# Patient Record
Sex: Male | Born: 1980 | Race: Black or African American | Hispanic: No | Marital: Single | Smoking: Never smoker
Health system: Southern US, Community
[De-identification: ages and names within clinical notes are randomized; demographics above are authoritative.]

## PROBLEM LIST (undated history)

## (undated) DIAGNOSIS — W3400XA Accidental discharge from unspecified firearms or gun, initial encounter: Secondary | ICD-10-CM

## (undated) HISTORY — PX: EYE SURGERY: SHX253

---

## 2006-06-06 ENCOUNTER — Emergency Department (HOSPITAL_COMMUNITY): Admission: EM | Admit: 2006-06-06 | Discharge: 2006-06-06 | Payer: Self-pay | Admitting: Emergency Medicine

## 2007-02-28 ENCOUNTER — Emergency Department (HOSPITAL_COMMUNITY): Admission: EM | Admit: 2007-02-28 | Discharge: 2007-02-28 | Payer: Self-pay | Admitting: Emergency Medicine

## 2007-10-04 IMAGING — CR DG CHEST 2V
2 series · 2 of 2 positions shown · non-contrast
Comparison: None.

Exam: Chest, 2 views.

HISTORY: Cough, sore throat, and fever.

[w chest pa]
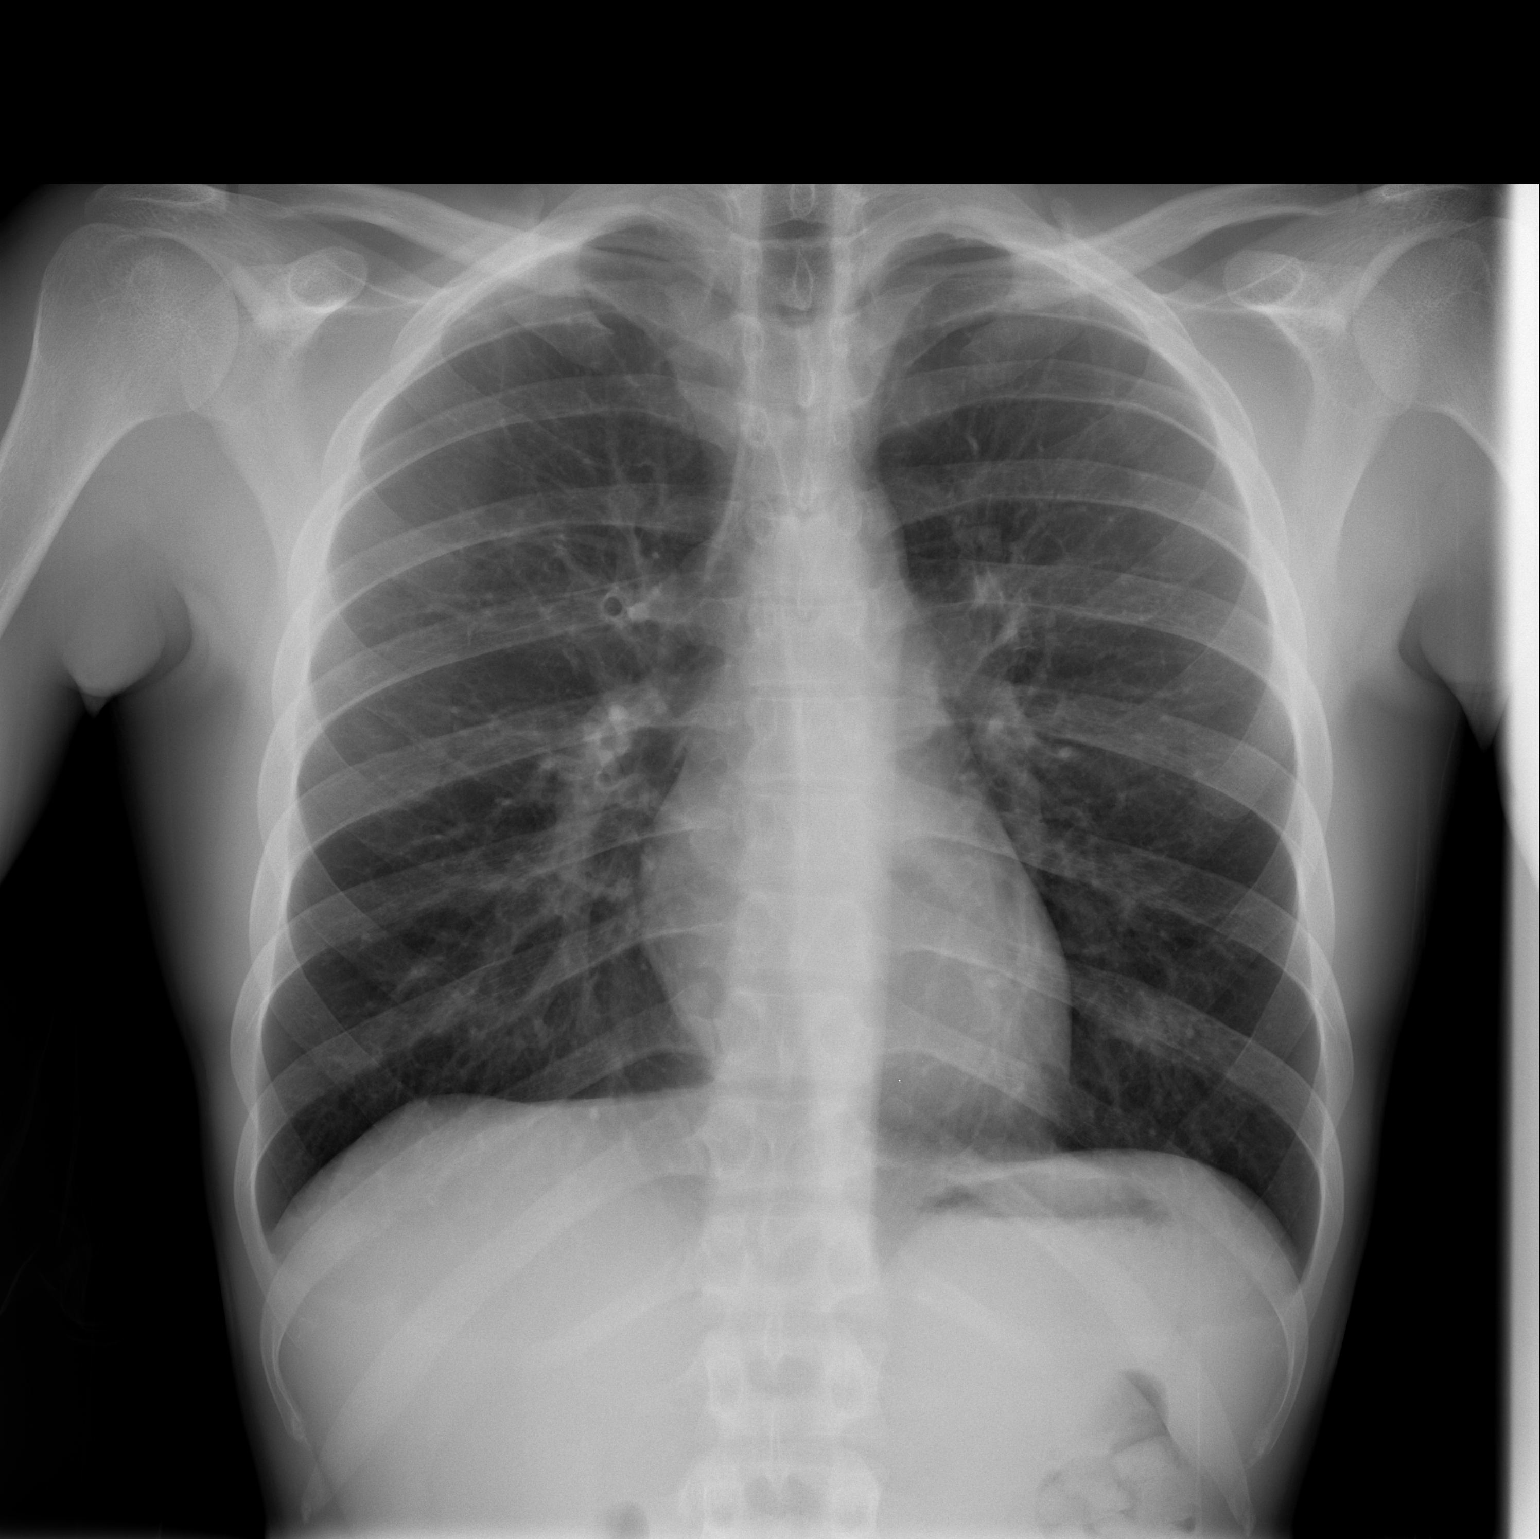

[w chest lat]
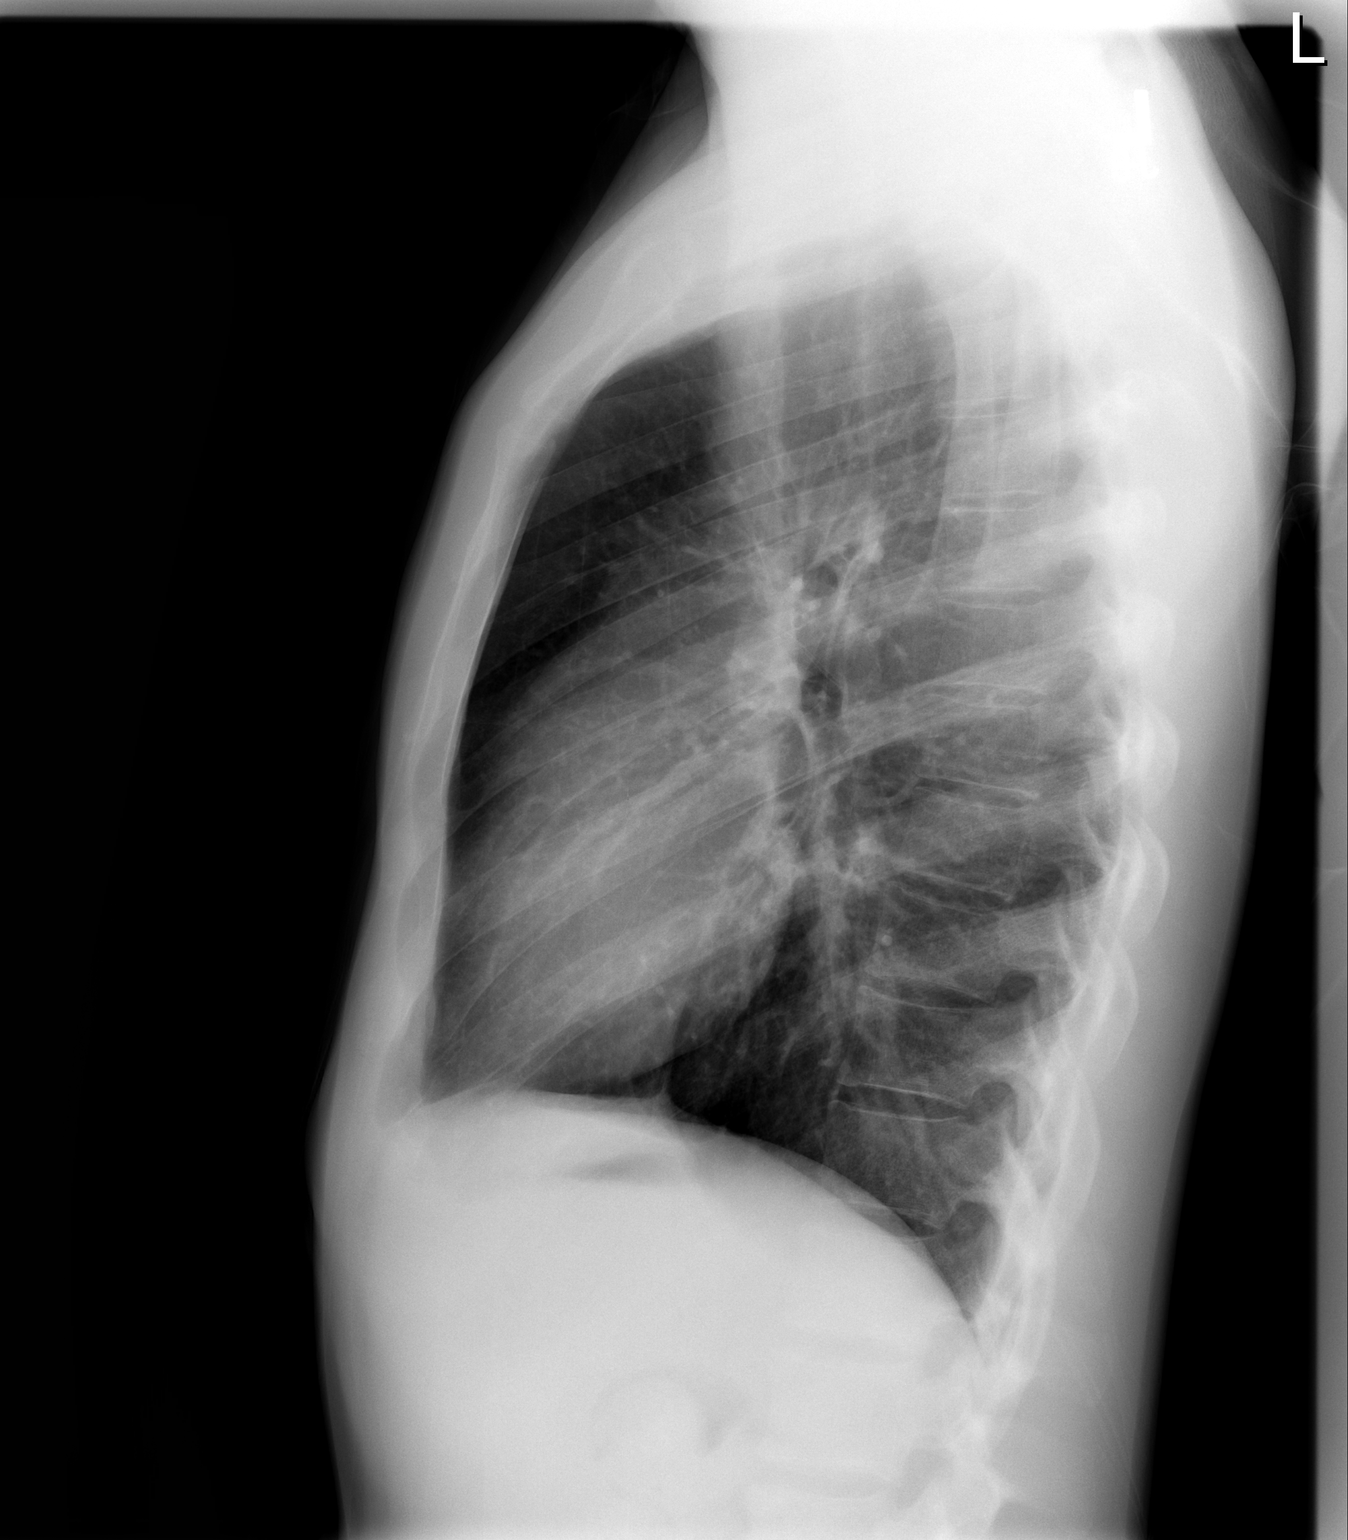

[2 of 2 positions shown; findings below may reference images not displayed]

FINDINGS: Heart size is normal.

No pleural effusions or pulmonary edema.

No airspace opacities are identified.
IMPRESSION: 1. No active pulmonary disease.
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
## 2010-05-26 ENCOUNTER — Emergency Department (HOSPITAL_COMMUNITY): Payer: No Typology Code available for payment source

## 2010-05-26 ENCOUNTER — Emergency Department (HOSPITAL_COMMUNITY): Admission: EM | Admit: 2010-05-26 | Payer: Self-pay | Source: Home / Self Care

## 2010-05-26 ENCOUNTER — Emergency Department (HOSPITAL_COMMUNITY)
Admission: EM | Admit: 2010-05-26 | Discharge: 2010-05-26 | Disposition: A | Discharge status: Home / Self Care | Payer: No Typology Code available for payment source | Attending: Emergency Medicine | Admitting: Emergency Medicine

## 2010-05-26 DIAGNOSIS — S62639A Displaced fracture of distal phalanx of unspecified finger, initial encounter for closed fracture: Secondary | ICD-10-CM | POA: Insufficient documentation

## 2010-05-26 DIAGNOSIS — M542 Cervicalgia: Secondary | ICD-10-CM | POA: Insufficient documentation

## 2010-05-26 DIAGNOSIS — M545 Low back pain, unspecified: Secondary | ICD-10-CM | POA: Insufficient documentation

## 2010-05-26 DIAGNOSIS — M79609 Pain in unspecified limb: Secondary | ICD-10-CM | POA: Insufficient documentation

## 2010-05-26 DIAGNOSIS — Y929 Unspecified place or not applicable: Secondary | ICD-10-CM | POA: Insufficient documentation

## 2010-05-30 ENCOUNTER — Inpatient Hospital Stay (INDEPENDENT_AMBULATORY_CARE_PROVIDER_SITE_OTHER)
Admission: RE | Admit: 2010-05-30 | Discharge: 2010-05-30 | Disposition: A | Discharge status: Home / Self Care | Payer: Self-pay | Source: Ambulatory Visit | Attending: Emergency Medicine | Admitting: Emergency Medicine

## 2010-05-30 DIAGNOSIS — S62639A Displaced fracture of distal phalanx of unspecified finger, initial encounter for closed fracture: Secondary | ICD-10-CM

## 2010-07-21 ENCOUNTER — Emergency Department (HOSPITAL_COMMUNITY): Payer: No Typology Code available for payment source

## 2010-07-21 ENCOUNTER — Emergency Department (HOSPITAL_COMMUNITY)
Admission: EM | Admit: 2010-07-21 | Discharge: 2010-07-21 | Disposition: A | Discharge status: Home / Self Care | Payer: No Typology Code available for payment source | Attending: Emergency Medicine | Admitting: Emergency Medicine

## 2010-07-21 DIAGNOSIS — M79609 Pain in unspecified limb: Secondary | ICD-10-CM | POA: Insufficient documentation

## 2010-07-21 DIAGNOSIS — M948X9 Other specified disorders of cartilage, unspecified sites: Secondary | ICD-10-CM | POA: Insufficient documentation

## 2010-07-21 DIAGNOSIS — S71109A Unspecified open wound, unspecified thigh, initial encounter: Secondary | ICD-10-CM | POA: Insufficient documentation

## 2010-07-21 DIAGNOSIS — W3400XA Accidental discharge from unspecified firearms or gun, initial encounter: Secondary | ICD-10-CM | POA: Insufficient documentation

## 2010-07-21 DIAGNOSIS — S71009A Unspecified open wound, unspecified hip, initial encounter: Secondary | ICD-10-CM | POA: Insufficient documentation

## 2010-07-21 DIAGNOSIS — Z1810 Retained metal fragments, unspecified: Secondary | ICD-10-CM | POA: Insufficient documentation

## 2010-07-21 LAB — COMPREHENSIVE METABOLIC PANEL
ALT: 12 U/L (ref 0–53)
Alkaline Phosphatase: 79 U/L (ref 39–117)
BUN: 14 mg/dL (ref 6–23)
CO2: 28 mEq/L (ref 19–32)
Calcium: 9.7 mg/dL (ref 8.4–10.5)
GFR calc non Af Amer: >60 mL/min (ref >60)
Glucose, Bld: 105 mg/dL — ABNORMAL HIGH (ref 70–99)
Potassium: 3.5 mEq/L (ref 3.5–5.1)
Sodium: 139 mEq/L (ref 135–145)

## 2010-07-21 LAB — POCT I-STAT, CHEM 8
Calcium, Ion: 1.11 mmol/L — ABNORMAL LOW (ref 1.12–1.32)
Creatinine, Ser: 1.5 mg/dL (ref 0.4–1.5)
Glucose, Bld: 104 mg/dL — ABNORMAL HIGH (ref 70–99)
HCT: 49 % (ref 39.0–52.0)
Hemoglobin: 16.7 g/dL (ref 13.0–17.0)
Potassium: 3.5 mEq/L (ref 3.5–5.1)
TCO2: 27 mmol/L (ref 0–100)

## 2010-07-21 LAB — CBC
Hemoglobin: 15.5 g/dL (ref 13.0–17.0)
MCH: 30.3 pg (ref 26.0–34.0)
MCHC: 35.5 g/dL (ref 30.0–36.0)
RDW: 12.8 % (ref 11.5–15.5)

## 2010-07-21 LAB — PROTIME-INR: Prothrombin Time: 15.3 seconds — ABNORMAL HIGH (ref 11.6–15.2)

## 2010-07-21 LAB — TYPE AND SCREEN
ABO/RH(D): A POS
Antibody Screen: NEGATIVE

## 2010-07-21 MED ORDER — IOHEXOL 300 MG/ML  SOLN
100.0000 mL | Freq: Once | INTRAMUSCULAR | Status: AC | PRN
  Administered 2010-07-21: 100 mL via INTRAVENOUS
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
## 2011-11-25 ENCOUNTER — Emergency Department (HOSPITAL_COMMUNITY)
Admission: EM | Admit: 2011-11-25 | Discharge: 2011-11-25 | Disposition: A | Discharge status: Home / Self Care | Payer: Self-pay | Attending: Emergency Medicine | Admitting: Emergency Medicine

## 2011-11-25 ENCOUNTER — Encounter (HOSPITAL_COMMUNITY): Payer: Self-pay | Admitting: Emergency Medicine

## 2011-11-25 DIAGNOSIS — S058X9A Other injuries of unspecified eye and orbit, initial encounter: Secondary | ICD-10-CM

## 2011-11-25 DIAGNOSIS — S0590XA Unspecified injury of unspecified eye and orbit, initial encounter: Secondary | ICD-10-CM | POA: Insufficient documentation

## 2011-11-25 DIAGNOSIS — IMO0002 Reserved for concepts with insufficient information to code with codable children: Secondary | ICD-10-CM | POA: Insufficient documentation

## 2011-11-25 HISTORY — DX: Accidental discharge from unspecified firearms or gun, initial encounter: W34.00XA

## 2011-11-25 MED ORDER — ARTIFICIAL TEARS OP OINT
TOPICAL_OINTMENT | Freq: Once | OPHTHALMIC | Status: AC
  Administered 2011-11-25: 2 via OPHTHALMIC
  Filled 2011-11-25: qty 3.5

## 2011-11-25 MED ORDER — TETRACAINE HCL 0.5 % OP SOLN
2.0000 [drp] | Freq: Once | OPHTHALMIC | Status: AC
  Administered 2011-11-25: 2 [drp] via OPHTHALMIC
  Filled 2011-11-25: qty 2

## 2011-11-25 NOTE — ED Provider Notes (Signed)
Medical screening examination/treatment/procedure(s) were performed by non-physician practitioner and as supervising physician I was immediately available for consultation/collaboration.  Jalaina Salyers, MD 11/25/11 0532 
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
## 2011-11-25 NOTE — ED Notes (Signed)
Ph 7 in left eye, 8 in right; NP Gail aware.
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
## 2011-11-25 NOTE — ED Notes (Signed)
PT ambulated with baseline gait; VSS; A&Ox3; no signs of distress; respirations even and unlabored; skin warm and dry; no questions upon discharge.  

## 2011-11-25 NOTE — ED Notes (Addendum)
Patient says both eyes got bleach splashed in them when he was cleaning the bathroom.  Patient says both eyes hurt, the feeling is almost like they are severely dry.  Eyes look red but pupils are equal and reactive.

## 2013-03-07 ENCOUNTER — Emergency Department (HOSPITAL_COMMUNITY)
Admission: EM | Admit: 2013-03-07 | Discharge: 2013-03-07 | Disposition: A | Discharge status: Home / Self Care | Payer: Self-pay | Attending: Emergency Medicine | Admitting: Emergency Medicine

## 2013-03-07 DIAGNOSIS — Z880 Allergy status to penicillin: Secondary | ICD-10-CM | POA: Insufficient documentation

## 2013-03-07 DIAGNOSIS — J3489 Other specified disorders of nose and nasal sinuses: Secondary | ICD-10-CM | POA: Insufficient documentation

## 2013-03-07 DIAGNOSIS — H109 Unspecified conjunctivitis: Secondary | ICD-10-CM | POA: Insufficient documentation

## 2013-03-07 DIAGNOSIS — Z87828 Personal history of other (healed) physical injury and trauma: Secondary | ICD-10-CM | POA: Insufficient documentation

## 2013-03-07 MED ORDER — ERYTHROMYCIN 5 MG/GM OP OINT: 1.0000 "application " | TOPICAL_OINTMENT | Freq: Four times a day (QID) | OPHTHALMIC | Status: DC

## 2013-03-07 MED ORDER — TETRACAINE HCL 0.5 % OP SOLN
1.0000 [drp] | Freq: Once | OPHTHALMIC | Status: AC
  Administered 2013-03-07: 1 [drp] via OPHTHALMIC
  Filled 2013-03-07: qty 2

## 2013-03-07 NOTE — Discharge Instructions (Signed)

## 2013-03-07 NOTE — ED Notes (Signed)
Pt states he started seeing eye drainage and redness in his L eye Friday. Has been taking OTC meds since and it has looked better.
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
## 2013-03-18 NOTE — ED Provider Notes (Signed)
Medical screening examination/treatment/procedure(s) were performed by non-physician practitioner and as supervising physician I was immediately available for consultation/collaboration.  EKG Interpretation   None        Jamire Shabazz K Apostolos Blagg-Rasch, MD 03/18/13 0002

## 2013-08-19 ENCOUNTER — Encounter (HOSPITAL_COMMUNITY): Payer: Self-pay | Admitting: Emergency Medicine

## 2013-08-19 ENCOUNTER — Emergency Department (HOSPITAL_COMMUNITY)
Admission: EM | Admit: 2013-08-19 | Discharge: 2013-08-19 | Disposition: A | Discharge status: Home / Self Care | Payer: Worker's Compensation | Attending: Emergency Medicine | Admitting: Emergency Medicine

## 2013-08-19 DIAGNOSIS — Y9389 Activity, other specified: Secondary | ICD-10-CM | POA: Insufficient documentation

## 2013-08-19 DIAGNOSIS — W268XXA Contact with other sharp object(s), not elsewhere classified, initial encounter: Secondary | ICD-10-CM | POA: Insufficient documentation

## 2013-08-19 DIAGNOSIS — S61512A Laceration without foreign body of left wrist, initial encounter: Secondary | ICD-10-CM

## 2013-08-19 DIAGNOSIS — Y9269 Other specified industrial and construction area as the place of occurrence of the external cause: Secondary | ICD-10-CM | POA: Insufficient documentation

## 2013-08-19 DIAGNOSIS — Z880 Allergy status to penicillin: Secondary | ICD-10-CM | POA: Insufficient documentation

## 2013-08-19 DIAGNOSIS — Z87828 Personal history of other (healed) physical injury and trauma: Secondary | ICD-10-CM | POA: Insufficient documentation

## 2013-08-19 DIAGNOSIS — Z79899 Other long term (current) drug therapy: Secondary | ICD-10-CM | POA: Insufficient documentation

## 2013-08-19 DIAGNOSIS — S61509A Unspecified open wound of unspecified wrist, initial encounter: Secondary | ICD-10-CM | POA: Insufficient documentation

## 2013-08-19 MED ORDER — TETANUS-DIPHTH-ACELL PERTUSSIS 5-2.5-18.5 LF-MCG/0.5 IM SUSP
0.5000 mL | Freq: Once | INTRAMUSCULAR | Status: AC
  Administered 2013-08-19: 0.5 mL via INTRAMUSCULAR
  Filled 2013-08-19: qty 0.5

## 2013-08-19 MED ORDER — TRAMADOL HCL 50 MG PO TABS
50.0000 mg | ORAL_TABLET | Freq: Once | ORAL | Status: AC
  Administered 2013-08-19: 50 mg via ORAL
  Filled 2013-08-19: qty 1

## 2013-08-19 NOTE — ED Notes (Signed)
Patient was at work, was using a box cutter to open a box at work, cut left wrist/forearm area.  Bleeding controlled.
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
## 2013-08-19 NOTE — ED Notes (Signed)
Discharge instructions reviewed and patient voiced understanding  

## 2013-08-19 NOTE — Discharge Instructions (Signed)
Keep wound clean. Return to the ED or follow up with your primary care provider in 10 days for suture removal. Refer to attached documents for more information.

## 2013-08-19 NOTE — ED Notes (Signed)
Phlebotomy notified for workmans comp urine sample

## 2013-08-21 NOTE — ED Provider Notes (Signed)
Medical screening examination/treatment/procedure(s) were performed by non-physician practitioner and as supervising physician I was immediately available for consultation/collaboration.   EKG Interpretation None        Kathleen M McManus, DO 08/21/13 1430 

## 2013-08-30 ENCOUNTER — Encounter (HOSPITAL_COMMUNITY): Payer: Self-pay | Admitting: Emergency Medicine

## 2013-08-30 ENCOUNTER — Emergency Department (HOSPITAL_COMMUNITY)
Admission: EM | Admit: 2013-08-30 | Discharge: 2013-08-30 | Disposition: A | Discharge status: Home / Self Care | Payer: Worker's Compensation | Attending: Emergency Medicine | Admitting: Emergency Medicine

## 2013-08-30 DIAGNOSIS — Z4802 Encounter for removal of sutures: Secondary | ICD-10-CM | POA: Insufficient documentation

## 2013-08-30 DIAGNOSIS — Z880 Allergy status to penicillin: Secondary | ICD-10-CM | POA: Insufficient documentation

## 2013-08-30 DIAGNOSIS — Z87828 Personal history of other (healed) physical injury and trauma: Secondary | ICD-10-CM | POA: Insufficient documentation

## 2013-08-30 NOTE — ED Provider Notes (Signed)
Medical screening examination/treatment/procedure(s) were performed by non-physician practitioner and as supervising physician I was immediately available for consultation/collaboration.   EKG Interpretation None        Jamala Kohen, MD 08/30/13 0551 
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
## 2013-08-30 NOTE — Discharge Instructions (Signed)
1. Medications: usual home medications 2. Treatment: rest, drink plenty of fluids,  3. Follow Up: Please followup with your primary doctor for discussion of your diagnoses and further evaluation after today's visit; if you do not have a primary care doctor use the resource guide provided to find one;     Suture Removal, Care After Refer to this sheet in the next few weeks. These instructions provide you with information on caring for yourself after your procedure. Your health care provider may also give you more specific instructions. Your treatment has been planned according to current medical practices, but problems sometimes occur. Call your health care provider if you have any problems or questions after your procedure. WHAT TO EXPECT AFTER THE PROCEDURE After your stitches (sutures) are removed, it is typical to have the following:  Some discomfort and swelling in the wound area.  Slight redness in the area. HOME CARE INSTRUCTIONS   If you have skin adhesive strips over the wound area, do not take the strips off. They will fall off on their own in a few days. If the strips remain in place after 14 days, you may remove them.  Change any bandages (dressings) at least once a day or as directed by your health care provider. If the bandage sticks, soak it off with warm, soapy water.  Apply cream or ointment only as directed by your health care provider. If using cream or ointment, wash the area with soap and water 2 times a day to remove all the cream or ointment. Rinse off the soap and pat the area dry with a clean towel.  Keep the wound area dry and clean. If the bandage becomes wet or dirty, or if it develops a bad smell, change it as soon as possible.  Continue to protect the wound from injury.  Use sunscreen when out in the sun. New scars become sunburned easily. SEEK MEDICAL CARE IF:  You have increasing redness, swelling, or pain in the wound.  You see pus coming from the  wound.  You have a fever.  You notice a bad smell coming from the wound or dressing.  Your wound breaks open (edges not staying together). Document Released: 11/12/2000 Document Revised: 12/08/2012 Document Reviewed: 09/29/2012 [facility] Patient Information 2015 [place], [place]. This information is not intended to replace advice given to you by your health care provider. Make sure you discuss any questions you have with your health care provider.

## 2013-08-30 NOTE — ED Notes (Signed)
Patient need sutures removed from right wrist.  No redness or signs of infection noted.

## 2014-09-20 DIAGNOSIS — W1789XA Other fall from one level to another, initial encounter: Secondary | ICD-10-CM | POA: Insufficient documentation

## 2014-09-20 DIAGNOSIS — Y9289 Other specified places as the place of occurrence of the external cause: Secondary | ICD-10-CM | POA: Insufficient documentation

## 2014-09-20 DIAGNOSIS — S0990XA Unspecified injury of head, initial encounter: Secondary | ICD-10-CM | POA: Insufficient documentation

## 2014-09-20 DIAGNOSIS — Z880 Allergy status to penicillin: Secondary | ICD-10-CM | POA: Insufficient documentation

## 2014-09-20 DIAGNOSIS — Y998 Other external cause status: Secondary | ICD-10-CM | POA: Insufficient documentation

## 2014-09-20 DIAGNOSIS — F0781 Postconcussional syndrome: Secondary | ICD-10-CM | POA: Insufficient documentation

## 2014-09-20 DIAGNOSIS — S199XXA Unspecified injury of neck, initial encounter: Secondary | ICD-10-CM | POA: Insufficient documentation

## 2014-09-20 DIAGNOSIS — Y9389 Activity, other specified: Secondary | ICD-10-CM | POA: Insufficient documentation

## 2014-09-21 ENCOUNTER — Emergency Department (HOSPITAL_COMMUNITY)
Admission: EM | Admit: 2014-09-21 | Discharge: 2014-09-21 | Disposition: A | Discharge status: Home / Self Care | Payer: Self-pay | Attending: Emergency Medicine | Admitting: Emergency Medicine

## 2014-09-21 ENCOUNTER — Emergency Department (HOSPITAL_COMMUNITY): Payer: Self-pay

## 2014-09-21 ENCOUNTER — Encounter (HOSPITAL_COMMUNITY): Payer: Self-pay | Admitting: Emergency Medicine

## 2014-09-21 DIAGNOSIS — F0781 Postconcussional syndrome: Secondary | ICD-10-CM

## 2014-09-21 MED ORDER — HYDROCODONE-ACETAMINOPHEN 5-325 MG PO TABS
ORAL_TABLET | ORAL | Status: AC
Start: 2014-09-21 — End: ?

## 2014-09-21 MED ORDER — HYDROCODONE-ACETAMINOPHEN 5-325 MG PO TABS
1.0000 | ORAL_TABLET | Freq: Once | ORAL | Status: AC
  Administered 2014-09-21: 1 via ORAL
  Filled 2014-09-21: qty 1

## 2014-09-21 NOTE — ED Notes (Addendum)
Pt from work c/o a fan falling on his head at about 2200 on 7/18. He reports top of head pain and neck pain.  He denies LOC
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
## 2014-09-21 NOTE — ED Provider Notes (Signed)
1610 - Patient care assumed from [facility], PA-C at shift change. Plan discussed with [place], PA-C which includes d/c if imaging negative. Imaging from today's evaluation reviewed. X-ray of the cervical spine is negative for fracture or bony deformity. Patient to be discharged with prescription for Vicodin, prescribed by prior provider. Return precautions given at discharge. Patient discharged in good condition.   Dg Cervical Spine Complete  09/21/2014   CLINICAL DATA:  34 year old male with trauma to the head and neck.  EXAM: CERVICAL SPINE  4+ VIEWS  COMPARISON:  Radiograph dated 05/26/2010  FINDINGS: There is no evidence of cervical spine fracture or prevertebral soft tissue swelling. Alignment is normal. No other significant bone abnormalities are identified.  IMPRESSION: No acute fracture or subluxation.   Electronically Signed   By: Elgie Collard M.D.   On: 09/21/2014 02:29      Antony Madura, PA-C 09/21/14 0240  Mirian Mo, MD 09/21/14 [phone]

## 2014-09-21 NOTE — Discharge Instructions (Signed)
Take vicodin for breakthrough pain, do not drink alcohol, drive, care for children or do other critical tasks while taking vicodin.  Do not participate in any sports or any activities that could result in head trauma until you are cleared by your pediatrician,  primary care physician or neurologist.   Do not hesitate to return to the emergency room for any new, worsening or concerning symptoms.  Please obtain primary care using resource guide below. Let them know that you were seen in the emergency room and that they will need to obtain records for further outpatient management.    Concussion A concussion is a brain injury. It is caused by:  A hit to the head.  A quick and sudden movement (jolt) of the head or neck. A concussion is usually not life threatening. Even so, it can cause serious problems. If you had a concussion before, you may have concussion-like problems after a hit to your head. HOME CARE General Instructions  Follow your doctor's directions carefully.  Take medicines only as told by your doctor.  Only take medicines your doctor says are safe.  Do not drink alcohol until your doctor says it is okay. Alcohol and some drugs can slow down healing. They can also put you at risk for further injury.  If you are having trouble remembering things, write them down.  Try to do one thing at a time if you get distracted easily. For example, do not watch TV while making dinner.  Talk to your family members or close friends when making important decisions.  Follow up with your doctor as told.  Watch your symptoms. Tell others to do the same. Serious problems can sometimes happen after a concussion. Older adults are more likely to have these problems.  Tell your teachers, school nurse, school counselor, coach, Event organiser, or work Production designer, theatre/television/film about your concussion. Tell them about what you can or cannot do. They should watch to see if:  It gets even harder for you to pay  attention or concentrate.  It gets even harder for you to remember things or learn new things.  You need more time than normal to finish things.  You become annoyed (irritable) more than before.  You are not able to deal with stress as well.  You have more problems than before.  Rest. Make sure you:  Get plenty of sleep at night.  Go to sleep early.  Go to bed at the same time every day. Try to wake up at the same time.  Rest during the day.  Take naps when you feel tired.  Limit activities where you have to think a lot or concentrate. These include:  Doing homework.  Doing work related to a job.  Watching TV.  Using the computer. Returning To Your Regular Activities Return to your normal activities slowly, not all at once. You must give your body and brain enough time to heal.   Do not play sports or do other athletic activities until your doctor says it is okay.  Ask your doctor when you can drive, ride a bicycle, or work other vehicles or machines. Never do these things if you feel dizzy.  Ask your doctor about when you can return to work or school. Preventing Another Concussion It is very important to avoid another brain injury, especially before you have healed. In rare cases, another injury can lead to permanent brain damage, brain swelling, or death. The risk of this is greatest during the first 7-10  days after your injury. Avoid injuries by:   Wearing a seat belt when riding in a car.  Not drinking too much alcohol.  Avoiding activities that could lead to a second concussion (such as contact sports).  Wearing a helmet when doing activities like:  Biking.  Skiing.  Skateboarding.  Skating.  Making your home safer by:  Removing things from the floor or stairways that could make you trip.  Using grab bars in bathrooms and handrails by stairs.  Placing non-slip mats on floors and in bathtubs.  Improve lighting in dark areas. GET HELP IF:  It  gets even harder for you to pay attention or concentrate.  It gets even harder for you to remember things or learn new things.  You need more time than normal to finish things.  You become annoyed (irritable) more than before.  You are not able to deal with stress as well.  You have more problems than before.  You have problems keeping your balance.  You are not able to react quickly when you should. Get help if you have any of these problems for more than 2 weeks:   Lasting (chronic) headaches.  Dizziness or trouble balancing.  Feeling sick to your stomach (nausea).  Seeing (vision) problems.  Being affected by noises or light more than normal.  Feeling sad, low, down in the dumps, blue, gloomy, or empty (depressed).  Mood changes (mood swings).  Feeling of fear or nervousness about what may happen (anxiety).  Feeling annoyed.  Memory problems.  Problems concentrating or paying attention.  Sleep problems.  Feeling tired all the time. GET HELP RIGHT AWAY IF:   You have bad headaches or your headaches get worse.  You have weakness (even if it is in one hand, leg, or part of the face).  You have loss of feeling (numbness).  You feel off balance.  You keep throwing up (vomiting).  You feel tired.  One black center of your eye (pupil) is larger than the other.  You twitch or shake violently (convulse).  Your speech is not clear (slurred).  You are more confused, easily angered (agitated), or annoyed than before.  You have more trouble resting than before.  You are unable to recognize people or places.  You have neck pain.  It is difficult to wake you up.  You have unusual behavior changes.  You pass out (lose consciousness). MAKE SURE YOU:   Understand these instructions.  Will watch your condition.  Will get help right away if you are not doing well or get worse. Document Released: 02/05/2009 Document Revised: 07/04/2013 Document  Reviewed: 09/09/2012 [facility] Patient Information 2015 [place], [place]. This information is not intended to replace advice given to you by your health care provider. Make sure you discuss any questions you have with your health care provider.  Emergency Department Resource Guide 1) Find a Doctor and Pay Out of Pocket Although you won't have to find out who is covered by your insurance plan, it is a good idea to ask around and get recommendations. You will then need to call the office and see if the doctor you have chosen will accept you as a new patient and what types of options they offer for patients who are self-pay. Some doctors offer discounts or will set up payment plans for their patients who do not have insurance, but you will need to ask so you aren't surprised when you get to your appointment.  2) Contact Your Local Health  Department Not all health departments have doctors that can see patients for sick visits, but many do, so it is worth a call to see if yours does. If you don't know where your local health department is, you can check in your phone book. The CDC also has a tool to help you locate your state's health department, and many state websites also have listings of all of their local health departments.  3) Find a Walk-in Clinic If your illness is not likely to be very severe or complicated, you may want to try a walk in clinic. These are popping up all over the country in pharmacies, drugstores, and shopping centers. They're usually staffed by nurse practitioners or physician assistants that have been trained to treat common illnesses and complaints. They're usually fairly quick and inexpensive. However, if you have serious medical issues or chronic medical problems, these are probably not your best option.  No Primary Care Doctor: - Call Health Connect at  [phone] - they can help you locate a primary care doctor that  accepts your insurance, provides certain services,  etc. - Physician Referral Service- [phone]  Chronic Pain Problems: Organization         Address  Phone   Notes  [facility]  [phone] Patients need to be referred by their primary care doctor.   Medication Assistance: Organization         Address  Phone   Notes  [facility] Medication [facility] [address] [place], [place] [address] [phone] --Must be a resident of [facility] -- Must have NO insurance coverage whatsoever (no Medicaid/ Medicare, etc.) -- The pt. MUST have a primary care doctor that directs their care regularly and follows them in the community   [facility]ist  [phone]   [employer]  [phone]    Agencies that provide inexpensive medical care: Organization         Address  Phone   Notes  [facility]  [phone]   [facility]    [phone]   [facility] [address] [place], [place] [address] [phone]   [facility] [address] [place], [place] [phone]   Planned Parenthood    [phone]   [facility]    [phone]   [facility]  [address], [place] Phone:  [phone], Fax:  [phone] Hours of Operation:  9 am - 6 pm, M-F.  Also accepts Medicaid/Medicare and self-pay.  [facility]  [address], [place] Phone: [phone], Fax: [phone]. Hours of Operation:  8:30 am - 5:30 pm, M-F.  Also accepts Medicaid and self-pay.  [facility] [place] [address] [place] Phone: [phone]   [facility] [address] Natasha Bence [place], [place] [phone], Ext. [phone] Mondays & Thursdays: 7-9 AM.  First 15 patients are seen on a first come, first serve basis.    Medicaid-accepting [facility] Providers:  Organization         Address  Phone   Notes  [facility] [address], [place] [phone] Also accepts self-pay patients.  [facility] [address] Laurell Josephs [place], [place]  [phone]   [facility] [address], [place] [phone]  [facility] [address] [place] [phone]   Renaye Rakers [address], [place]   [phone] Only accepts [place] Access [place] patients after they have their name applied to their card.   Self-Pay (no insurance) in [facility]:  Organization         Address  Phone   Notes  Sickle Cell Patients, [facility] [address] [place], [place] [phone]   [facility] [address] [place], [place] [phone]   [facility] [place]  [address], [place] [phone]   [facility]/Dr. Osei-Bonsu  [address] [place] or [address], [place] [phone] Phone number for both [place] and [place] locations is the same.  [facility]gent [facility] and [facility] [address] [place] [phone]   [facility] [address], [place] or [address] [phone]   [facility] [address], [place] [phone], phone; [phone], fax Sees patients 1st and 3rd Saturday of every month.  Must not qualify for public or private insurance (i.e. Medicaid, Medicare, [place] Health Choice, Veterans' Benefits)  Household income should be no more than 200% of the poverty level The clinic cannot treat you if you are pregnant or think you are pregnant  Sexually transmitted diseases are not treated at the clinic.    Dental Care: Organization         Address  Phone  Notes  [facility] Department of [facility] [address] [place], [place] [phone] Accepts children up to  age 63 who are enrolled in [place] or [place] Health Choice; pregnant women with a Medicaid card; and children who have applied for Medicaid or [place] Health Choice, but were declined, whose parents can pay a reduced fee at time of service.  [facility] Department of [facility]  [address], [place] [phone] Accepts children up to age 9 who are enrolled in [place] or [place] Health Choice; pregnant women with a Medicaid card; and children who have applied for Medicaid or [place] Health Choice, but were declined, whose parents can pay a reduced fee at time of service.  [facility]  [address] [place], [place] [phone] Patients are seen by appointment only. Walk-ins are not accepted. [facility] will see patients 39 years of age and older. Monday - Tuesday (8am-5pm) Most Wednesdays (8:30-5pm) $30 per visit, cash only  [facility] Adult Dental Access PRO[facility]  [address], [facility] [phone] Patients are seen by appointment only. Walk-ins are not accepted. [facility] will see patients 44 years of age and older. One Wednesday Evening (Monthly: Volunteer Based).  $30 per visit, cash only  [employer]  [phone] for adults; Children under age 10, call Graduate Pediatric Dentistry at [phone]. Children aged 68-14, please call [phone] to request a pediatric application.  Dental services are provided in all areas of dental care including fillings, crowns and bridges, complete and partial dentures, implants, gum treatment, root canals, and extractions. Preventive care is also provided. Treatment is provided to both adults and children. Patients are selected via a lottery and there is often a waiting list.   [facility] [address] [place]  [phone] [email or web address]  [facility] [address] [place], [place] [phone], Ext. [phone] Second and Fourth Thursday of  each month, opens at 6:30 AM; Clinic ends at 9 AM.  Patients are seen on a first-come first-served basis, and a limited number are seen during each clinic.   [facility]  [address] Ether [place]ins [place], [place] [phone]   Eligibility Requirements You must have lived in [place], [place], or [place] counties for at least the last three months.   You cannot be eligible for state or federal sponsored [place], including [employer], [place], or [employer].   You generally cannot be eligible for healthcare insurance through your employer.    How to apply: Eligibility screenings are held every Tuesday and Wednesday afternoon from 1:00 pm until 4:00 pm. You do not need an appointment for the interview!  [facility] [address], [place], [place] [phone]   [facility] Health Department  [phone]   [facility] Health Department  [phone]   [facility] Health Department  [phone]    Behavioral Health Resources in the Community: Intensive Outpatient Programs Organization         Address  Phone  Notes  [facility] [address] [place], [place] [phone]   [facility] [address] [place], [place] [phone]   [facility] [address], [place], [place]  [phone]   [facility] [address]  [place], [place] [phone] or [phone]   Substance Abuse Resources Organization         Address  Phone  Notes  [facility]  [phone]   [facility]  [phone]   [facility] [place]  [phone]   Floydene Flock  [phone]   Residential & Outpatient Substance Abuse Program  [phone]   Psychological Services Organization         Address  Phone  Notes  [facility]  [phone]   [facility]  [phone]   [facility] [address]  [place] [phone] or [phone]    Mobile Crisis Teams Organization         Address  Phone  Notes  [facility], Mobile Crisis Care Unit  [phone]   [facility]  [address] [place], [place] [phone]   Doristine Locks [address] [place] [phone]    Self-Help/Support Groups Organization         Address  Phone             Notes  [facility] [place] - variety of support groups  336- I7437963 Call for more information  Narcotics Anonymous (NA), [facility] [address], [employer] [place]  2 meetings at this location   Statistician         Address  Phone  Notes  [facility] [address],    [place]  [phone]   [facility]  [address] [place] [address], [place], [place] [phone]   [facility] [address] [place], [place] [phone] Admissions: 8am-3pm M-F  [facility] [address]    [place], [place] [phone]   [facility] [address] Starling Manns [place], [place] [phone]   [facility] [address],  [place], [place] [phone]   [facility] - Intensive Outpatient [phone] Laurell Josephs [address], [place], [place] [phone]   [facility] ([facility]iction [facility] [address] [place]  [place], [place] [phone] or [phone]   [facility] ([facility]) [address] [place], [place] [phone] Accepts Medicaid  [facility] [address]  [place] [phone] Substance Abuse/Addiction Treatment   [facility] Organization         Address  Phone  Notes  [facility]  [phone]   Angie Fava, PhD [address] Ervin Knack [place], [place]   [phone] or [phone]   [facility] Behavioral   [address] [place], [place] [phone]     [facility] [address], [place], [place] [phone] Insurance/Medicaid/sponsorship through [facility] and Families [address]                                    [place], [place] [phone] Therapy/tele-psych/case  [facility] [address], [place] [phone]    Dr. Lolly Mustache  [phone]   [facility] [place]  [employer] [facility] Dept. 1) [address] [place] [address] [place] 2) [address] [place] 3)  [address], [place] [phone]  [phone]   [facility] Child Abuse Hotline (828)272-5590 or [phone] (After Hours)

## 2016-10-03 ENCOUNTER — Emergency Department (HOSPITAL_COMMUNITY)
Admission: EM | Admit: 2016-10-03 | Discharge: 2016-10-03 | Disposition: A | Discharge status: Home / Self Care | Payer: Self-pay | Attending: Emergency Medicine | Admitting: Emergency Medicine

## 2016-10-03 ENCOUNTER — Encounter (HOSPITAL_COMMUNITY): Payer: Self-pay | Admitting: *Deleted

## 2016-10-03 ENCOUNTER — Emergency Department (HOSPITAL_COMMUNITY): Payer: Self-pay

## 2016-10-03 DIAGNOSIS — Y929 Unspecified place or not applicable: Secondary | ICD-10-CM | POA: Insufficient documentation

## 2016-10-03 DIAGNOSIS — Y999 Unspecified external cause status: Secondary | ICD-10-CM | POA: Insufficient documentation

## 2016-10-03 DIAGNOSIS — S41112A Laceration without foreign body of left upper arm, initial encounter: Secondary | ICD-10-CM | POA: Insufficient documentation

## 2016-10-03 DIAGNOSIS — Y9389 Activity, other specified: Secondary | ICD-10-CM | POA: Insufficient documentation

## 2016-10-03 DIAGNOSIS — Z23 Encounter for immunization: Secondary | ICD-10-CM | POA: Insufficient documentation

## 2016-10-03 MED ORDER — BACITRACIN ZINC 500 UNIT/GM EX OINT: TOPICAL_OINTMENT | Freq: Two times a day (BID) | CUTANEOUS | Status: DC

## 2016-10-03 MED ORDER — TETANUS-DIPHTH-ACELL PERTUSSIS 5-2.5-18.5 LF-MCG/0.5 IM SUSP
0.5000 mL | Freq: Once | INTRAMUSCULAR | Status: AC
  Administered 2016-10-03: 0.5 mL via INTRAMUSCULAR
  Filled 2016-10-03: qty 0.5

## 2016-10-03 MED ORDER — LIDOCAINE HCL (PF) 1 % IJ SOLN
10.0000 mL | Freq: Once | INTRAMUSCULAR | Status: AC
  Administered 2016-10-03: 10 mL via INTRADERMAL
  Filled 2016-10-03: qty 10
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
## 2016-10-03 NOTE — ED Notes (Signed)
Wounds cleaned out, prepped for suturing

## 2016-10-03 NOTE — ED Notes (Signed)
Declined W/C at D/C and was escorted to lobby by RN. 

## 2016-10-03 NOTE — Discharge Instructions (Signed)
Keep the wound clean and dry for the first 24 hours. After that you may gently clean the wound with soap and water. Make sure to pat dry the wound before covering it with any dressing. You can use topical antibiotic ointment and bandage. Ice and elevate for pain relief.  ° °You can take Tylenol or Ibuprofen as directed for pain. You can alternate Tylenol and Ibuprofen every 4 hours for additional pain relief.  ° °Return to the Emergency Department, your primary care doctor, or the  Urgent Care Center in 5-7 days for suture removal.  ° °Monitor closely for any signs of infection. Return to the Emergency Department for any worsening redness/swelling of the area that begins to spread, drainage from the site, worsening pain, fever or any other worsening or concerning symptoms.  ° ° °
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
## 2019-07-25 ENCOUNTER — Other Ambulatory Visit: Payer: Self-pay

## 2019-07-25 ENCOUNTER — Encounter (HOSPITAL_COMMUNITY): Payer: Self-pay | Admitting: Emergency Medicine

## 2019-07-25 ENCOUNTER — Ambulatory Visit (HOSPITAL_COMMUNITY)
Admission: EM | Admit: 2019-07-25 | Discharge: 2019-07-25 | Disposition: A | Discharge status: Home / Self Care | Payer: Self-pay | Attending: Family Medicine | Admitting: Family Medicine

## 2019-07-25 DIAGNOSIS — G4489 Other headache syndrome: Secondary | ICD-10-CM

## 2019-07-25 MED ORDER — DEXAMETHASONE SODIUM PHOSPHATE 10 MG/ML IJ SOLN
INTRAMUSCULAR | Status: AC
  Filled 2019-07-25: qty 1

## 2019-07-25 MED ORDER — KETOROLAC TROMETHAMINE 60 MG/2ML IM SOLN
60.0000 mg | Freq: Once | INTRAMUSCULAR | Status: AC
  Administered 2019-07-25: 60 mg via INTRAMUSCULAR

## 2019-07-25 MED ORDER — RIZATRIPTAN BENZOATE 5 MG PO TABS: 5.0000 mg | ORAL_TABLET | ORAL | 0 refills | Status: AC | PRN

## 2019-07-25 MED ORDER — KETOROLAC TROMETHAMINE 60 MG/2ML IM SOLN
INTRAMUSCULAR | Status: AC
  Filled 2019-07-25: qty 2

## 2019-07-25 MED ORDER — DEXAMETHASONE SODIUM PHOSPHATE 10 MG/ML IJ SOLN
10.0000 mg | Freq: Once | INTRAMUSCULAR | Status: AC
  Administered 2019-07-25: 10 mg via INTRAMUSCULAR

## 2019-07-25 MED ORDER — IBUPROFEN 800 MG PO TABS: 800.0000 mg | ORAL_TABLET | Freq: Three times a day (TID) | ORAL | 0 refills | Status: AC | PRN
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
## 2019-07-25 NOTE — Discharge Instructions (Addendum)
I have sent in prescription strength ibuprofen  I have also sent in Maxalt for your headache  You have received Toradol and Decadron in the office to help stop your pain  Follow up as needed
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
## 2023-08-21 ENCOUNTER — Encounter (HOSPITAL_COMMUNITY): Payer: Self-pay

## 2023-08-21 ENCOUNTER — Emergency Department (HOSPITAL_COMMUNITY): Payer: Self-pay

## 2023-08-21 ENCOUNTER — Other Ambulatory Visit: Payer: Self-pay

## 2023-08-21 ENCOUNTER — Emergency Department (HOSPITAL_COMMUNITY)
Admission: EM | Admit: 2023-08-21 | Discharge: 2023-08-21 | Disposition: A | Discharge status: Home / Self Care | Payer: Self-pay | Attending: Emergency Medicine | Admitting: Emergency Medicine

## 2023-08-21 DIAGNOSIS — Z23 Encounter for immunization: Secondary | ICD-10-CM | POA: Insufficient documentation

## 2023-08-21 DIAGNOSIS — S0990XA Unspecified injury of head, initial encounter: Secondary | ICD-10-CM

## 2023-08-21 DIAGNOSIS — S0001XA Abrasion of scalp, initial encounter: Secondary | ICD-10-CM | POA: Insufficient documentation

## 2023-08-21 MED ORDER — TETANUS-DIPHTH-ACELL PERTUSSIS 5-2.5-18.5 LF-MCG/0.5 IM SUSY
0.5000 mL | PREFILLED_SYRINGE | Freq: Once | INTRAMUSCULAR | Status: AC
  Administered 2023-08-21: 0.5 mL via INTRAMUSCULAR
  Filled 2023-08-21: qty 0.5
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
## 2023-08-21 NOTE — Discharge Instructions (Addendum)
 Your head CT was normal.  You can take Motrin  and Tylenol  tonight.  Follow-up with your primary care doctor.
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
